# Patient Record
Sex: Female | Born: 1995 | Race: White | Hispanic: No | Marital: Single | Smoking: Never smoker
Health system: Southern US, Community
[De-identification: ages and names within clinical notes are randomized; demographics above are authoritative.]

## PROBLEM LIST (undated history)

## (undated) DIAGNOSIS — K219 Gastro-esophageal reflux disease without esophagitis: Secondary | ICD-10-CM

## (undated) DIAGNOSIS — T7840XA Allergy, unspecified, initial encounter: Secondary | ICD-10-CM

## (undated) HISTORY — DX: Gastro-esophageal reflux disease without esophagitis: K21.9

## (undated) HISTORY — DX: Allergy, unspecified, initial encounter: T78.40XA

---
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
## 2014-06-01 ENCOUNTER — Other Ambulatory Visit: Payer: Self-pay | Admitting: Urgent Care

## 2014-09-06 ENCOUNTER — Telehealth: Payer: Self-pay | Admitting: Family Medicine

## 2014-09-06 DIAGNOSIS — N946 Dysmenorrhea, unspecified: Secondary | ICD-10-CM

## 2014-09-06 MED ORDER — DESOGESTREL-ETHINYL ESTRADIOL 0.15-30 MG-MCG PO TABS: 1.0000 | ORAL_TABLET | Freq: Every day | ORAL | Status: DC

## 2014-09-06 NOTE — Telephone Encounter (Signed)
LMTCB Mila Pair Drozdowski, CMA  

## 2014-09-06 NOTE — Telephone Encounter (Signed)
Pt request a nurse to return their call:  Pt stated she stopped taking Aviane earlier in May but would like to try another brand of birthcontrol. Thanks TNP

## 2014-09-06 NOTE — Telephone Encounter (Signed)
Pt states she wants to restart OCP's to control dysmenorrhea and severe PMS sx. LMP started 08/25/2014 and lasted until yesterday. Pt reports she has never been sexually active. Spoke with Dr. Elease HashimotoMaloney, and she agrees to start pt on higher estrogen than Aviane (20mcg) to control PMS sx. Sent in Desogen (30mcg) to [facility] on [address] Pt will schedule 3 week FU. Allene DillonEmily Drozdowski, CMA

## 2014-09-08 ENCOUNTER — Ambulatory Visit (INDEPENDENT_AMBULATORY_CARE_PROVIDER_SITE_OTHER): Payer: BLUE CROSS/BLUE SHIELD | Admitting: Family Medicine

## 2014-09-08 ENCOUNTER — Encounter: Payer: Self-pay | Admitting: Family Medicine

## 2014-09-08 VITALS — BP 100/62 | HR 76 | Temp 98.2°F | Resp 16 | Ht 63.75 in | Wt 148.0 lb

## 2014-09-08 DIAGNOSIS — J309 Allergic rhinitis, unspecified: Secondary | ICD-10-CM | POA: Insufficient documentation

## 2014-09-08 DIAGNOSIS — F410 Panic disorder [episodic paroxysmal anxiety] without agoraphobia: Secondary | ICD-10-CM | POA: Insufficient documentation

## 2014-09-08 DIAGNOSIS — N943 Premenstrual tension syndrome: Secondary | ICD-10-CM | POA: Diagnosis not present

## 2014-09-08 DIAGNOSIS — N760 Acute vaginitis: Secondary | ICD-10-CM

## 2014-09-08 DIAGNOSIS — N946 Dysmenorrhea, unspecified: Secondary | ICD-10-CM | POA: Insufficient documentation

## 2014-09-08 DIAGNOSIS — N926 Irregular menstruation, unspecified: Secondary | ICD-10-CM | POA: Insufficient documentation

## 2014-09-08 DIAGNOSIS — B373 Candidiasis of vulva and vagina: Secondary | ICD-10-CM | POA: Diagnosis not present

## 2014-09-08 DIAGNOSIS — B3731 Acute candidiasis of vulva and vagina: Secondary | ICD-10-CM

## 2014-09-08 DIAGNOSIS — K219 Gastro-esophageal reflux disease without esophagitis: Secondary | ICD-10-CM | POA: Insufficient documentation

## 2014-09-08 DIAGNOSIS — G4700 Insomnia, unspecified: Secondary | ICD-10-CM | POA: Insufficient documentation

## 2014-09-08 MED ORDER — TERCONAZOLE 0.8 % VA CREA: 1.0000 | TOPICAL_CREAM | Freq: Every day | VAGINAL | Status: DC

## 2014-09-08 NOTE — Progress Notes (Signed)
   Subjective:    Patient ID: Heather Steele, female    DOB: 14-Mar-1996, 19 y.o.   MRN: [id number]  Vaginal Pain The patient's pertinent negatives include no genital itching, genital lesions, genital odor, genital rash, missed menses, pelvic pain, vaginal bleeding or vaginal discharge. Primary symptoms comment: Pt's main complaint is "vaginal burning". This is a new problem. The current episode started in the past 7 days. The problem occurs constantly. The problem has been gradually worsening. The pain is mild. The problem affects both sides. She is not pregnant. Pertinent negatives include no abdominal pain, anorexia, back pain, chills, constipation, diarrhea, discolored urine, dysuria, fever, flank pain, frequency, headaches, hematuria, joint pain, joint swelling, nausea, rash, sore throat, urgency or vomiting. Treatments tried: OTC hemorrhoid cream for a possible hemorrhoid, pt believes this cream is what caused the pain. She is not sexually active. Her past medical history is significant for menorrhagia.      Review of Systems  Constitutional: Negative for fever and chills.  HENT: Negative for sore throat.   Gastrointestinal: Negative for nausea, vomiting, abdominal pain, diarrhea, constipation and anorexia.  Genitourinary: Positive for vaginal pain and menorrhagia. Negative for dysuria, urgency, frequency, hematuria, flank pain, vaginal discharge, pelvic pain and missed menses.  Musculoskeletal: Negative for back pain and joint pain.  Skin: Negative for rash.  Neurological: Negative for headaches.   BP 100/62 mmHg  Pulse 76  Temp(Src) 98.2 F (36.8 C) (Oral)  Resp 16  Ht 5' 3.75" (1.619 m)  Wt 148 lb (67.132 kg)  BMI 25.61 kg/m2  LMP 08/25/2014 (Exact Date)   No past medical history on file. Past Surgical History  Procedure Laterality Date  . Knee surgery Left 12/24/2009    Dr. Celedonio Savage    reports that she has never smoked. She has never used smokeless tobacco. She reports that she  does not drink alcohol or use illicit drugs. family history includes Asthma in her brother; Stroke in her maternal grandfather. No Known Allergies     Objective:   Physical Exam  Constitutional: She is oriented to person, place, and time. She appears well-developed and well-nourished.  Genitourinary: Vaginal discharge found.  Neurological: She is alert and oriented to person, place, and time.  Psychiatric: She has a normal mood and affect.          Assessment & Plan:  1. Vaginitis New problem. Most Likely candida, will send wet prep to lab. FU pending results. Pt adamant she is not sexually active, but depending on wet prep results, may have to check for G/C Chlamydia.  - WET PREP FOR TRICH, YEAST, CLUE  Also for PMDD, discussed risks and benefits of OCPs. Will start medication on Sunday.  No currently sexually active, but did discuss does not prevent STDs and when miss pills need back up method.   Patient seen and examined by Leo Grosser, MD, and note scribed by Allene Dillon, CMA.  I have reviewed the document for accuracy and completeness and I agree with above. Leo Grosser, MD   Lorie Phenix, MD

## 2014-09-10 LAB — WET PREP FOR TRICH, YEAST, CLUE
Clue Cell Exam: NEGATIVE
TRICHOMONAS EXAM: NEGATIVE
YEAST EXAM: NEGATIVE

## 2014-09-11 ENCOUNTER — Telehealth: Payer: Self-pay

## 2014-09-11 NOTE — Telephone Encounter (Signed)
-----   Message from Lorie Phenix, MD sent at 09/10/2014  1:12 PM EDT ----- Wet prep normal. Please see how patient is doing.  Thanks.

## 2014-09-11 NOTE — Telephone Encounter (Signed)
Pt advised as directed below.  She reports feeling much better.    Thanks,   -Vernona Rieger

## 2014-09-13 ENCOUNTER — Ambulatory Visit: Payer: Self-pay | Admitting: Family Medicine

## 2014-09-27 ENCOUNTER — Ambulatory Visit: Payer: Self-pay | Admitting: Family Medicine

## 2014-12-12 ENCOUNTER — Encounter: Payer: Self-pay | Admitting: Family Medicine

## 2014-12-12 ENCOUNTER — Ambulatory Visit (INDEPENDENT_AMBULATORY_CARE_PROVIDER_SITE_OTHER): Payer: BLUE CROSS/BLUE SHIELD | Admitting: Family Medicine

## 2014-12-12 VITALS — BP 105/56 | HR 100 | Temp 98.6°F | Resp 16 | Ht 63.5 in | Wt 152.0 lb

## 2014-12-12 DIAGNOSIS — Z0000 Encounter for general adult medical examination without abnormal findings: Secondary | ICD-10-CM

## 2014-12-12 DIAGNOSIS — N926 Irregular menstruation, unspecified: Secondary | ICD-10-CM

## 2014-12-12 DIAGNOSIS — R748 Abnormal levels of other serum enzymes: Secondary | ICD-10-CM | POA: Diagnosis not present

## 2014-12-12 DIAGNOSIS — Z23 Encounter for immunization: Secondary | ICD-10-CM | POA: Diagnosis not present

## 2014-12-12 NOTE — Addendum Note (Signed)
Addended by: Leo Grosser on: 12/12/2014 08:04 PM   Modules accepted: Kipp Brood

## 2014-12-12 NOTE — Progress Notes (Addendum)
Patient ID: Heather Steele, female   DOB: 11/20/1995, 19 y.o.   MRN: [id number]        Patient: Heather Steele, Female    DOB: Apr 25, 1995, 19 y.o.   MRN: [id number] Visit Date: 12/12/2014  Today's Provider: Margarita Rana, MD   Chief Complaint  Patient presents with  . Annual Exam   Subjective:    Annual physical exam Heather Steele is a 19 y.o. female who presents today for health maintenance and complete physical. She feels well. She reports exercising DAILY. She reports she is sleeping well. School is going well. 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 Looking into working into college athletics. Has 3 room mates.  Getting along well. No boyfriend. Home is going well. Good relationship with parents. Both work at [employer].  Brother also there.  Anxiety has been well controlled. Has periodic chest issues.  Thinks related to sleep.   Birth control has really helped her cycle.  Does feel bloated at times.   12/08/13 CPE 12/08/13 EKG  Lab Results  Component Value Date   HGB 13.9 12/15/2013   HCT 41 12/15/2013   PLT 303 12/15/2013   ALT 8 12/15/2013   AST 41* 12/15/2013   NA 140 12/15/2013   K 4.2 12/15/2013   CREATININE 0.8 12/15/2013   BUN 10 12/15/2013   TSH 2.35 12/15/2013    -----------------------------------------------------------------   Review of Systems  Constitutional: Negative.   HENT: Negative.   Eyes: Negative.   Respiratory: Negative.   Cardiovascular: Negative.   Gastrointestinal: Negative.   Endocrine: Negative.   Genitourinary: Negative.   Musculoskeletal: Negative.   Skin: Negative.   Allergic/Immunologic: Negative.   Neurological: Negative.   Hematological: Negative.   Psychiatric/Behavioral: Negative.     Social History She  reports that she has never smoked. She has never used smokeless tobacco. She reports that she does not drink alcohol or use illicit drugs. Social History   Social History  . Marital Status: Single    Spouse Name:  N/A  . Number of Children: N/A  . Years of Education: N/A   Social History Main Topics  . Smoking status: Never Smoker   . Smokeless tobacco: Never Used  . Alcohol Use: No  . Drug Use: No  . Sexual Activity: No   Other Topics Concern  . None   Social History Narrative    Patient Active Problem List   Diagnosis Date Noted  . Allergic rhinitis 09/08/2014  . Gastro-esophageal reflux disease without esophagitis 09/08/2014  . Cannot sleep 09/08/2014  . Irregular bleeding 09/08/2014  . Dysmenorrhea 09/08/2014  . Panic attack 09/08/2014    Past Surgical History  Procedure Laterality Date  . Knee surgery Left 12/24/2009    Dr. Horton Chin    Family History  Family Status  Relation Status Death Age  . Brother Alive   . Mother Alive   . Father Alive   . Brother Alive   . Brother Alive    Her family history includes Asthma in her brother; Stroke in her maternal grandfather.    No Known Allergies  Previous Medications   DESOGESTREL-ETHINYL ESTRADIOL (JULEBER) 0.15-30 MG-MCG TABLET    Take 1 tablet by mouth daily.    Patient Care Team: Margarita Rana, MD as PCP - General (Family Medicine)     Objective:   Vitals: BP 105/56 mmHg  Pulse 100  Temp(Src) 98.6 F (37 C) (Oral)  Resp 16  Ht 5' 3.5" (1.613 m)  Wt 152  lb (68.947 kg)  BMI 26.50 kg/m2  LMP 11/28/2014 (Exact Date)   Physical Exam  Constitutional: She is oriented to person, place, and time. She appears well-developed and well-nourished.  HENT:  Head: Normocephalic and atraumatic.  Right Ear: Tympanic membrane, external ear and ear canal normal.  Left Ear: Tympanic membrane, external ear and ear canal normal.  Nose: Nose normal.  Mouth/Throat: Uvula is midline, oropharynx is clear and moist and mucous membranes are normal.  Eyes: Conjunctivae, EOM and lids are normal. Pupils are equal, round, and reactive to light.  Neck: Trachea normal and normal range of motion. Neck supple. Carotid bruit is not present. No  thyroid mass and no thyromegaly present.  Cardiovascular: Normal rate, regular rhythm and normal heart sounds.   Pulmonary/Chest: Effort normal and breath sounds normal.  Abdominal: Soft. Normal appearance and bowel sounds are normal. There is no hepatosplenomegaly. There is no tenderness.  Musculoskeletal: Normal range of motion.  Lymphadenopathy:    She has no cervical adenopathy.    She has no axillary adenopathy.  Neurological: She is alert and oriented to person, place, and time. She has normal strength. No cranial nerve deficit.  Skin: Skin is warm, dry and intact.  Psychiatric: She has a normal mood and affect. Her speech is normal and behavior is normal. Judgment and thought content normal. Cognition and memory are normal.     Depression Screen PHQ 2/9 Scores 09/08/2014  PHQ - 2 Score 0      Assessment & Plan:     Routine Health Maintenance and Physical Exam  Exercise Activities and Dietary recommendations Goals    . Exercise 150 minutes per week (moderate activity)       Immunization History  Administered Date(s) Administered  . DTaP 06/03/1994, 08/05/1994, 10/27/1994, 11/06/1995, 07/13/1998  . HPV Quadrivalent 11/05/2006, 01/13/2007, 05/20/2007  . Hepatitis A 11/05/2006, 05/20/2007  . Hepatitis B 07-Feb-1995, 08/27/1994, 01/05/1995  . HiB (PRP-OMP) 06/03/1994, 08/05/1994, 10/27/1994, 08/05/1995  . IPV 06/03/1994, 08/05/1994, 11/06/1995, 07/13/1998  . MMR 07/31/1995, 07/13/1998  . Meningococcal Conjugate 11/05/2006  . Tdap 09/17/2005  . Varicella 04/08/1996, 09/17/2005    Health Maintenance  Topic Date Due  . HIV Screening  04/04/2009  . TETANUS/TDAP  04/04/2013  . INFLUENZA VACCINE  10/30/2014      1. Annual physical exam Continue to eat healthy and exercise. Anxiety under good control at this point. Call if worsens or does not improve.    2. Need for influenza vaccination - Flu Vaccine QUAD 36+ mos PF IM (Fluarix & Fluzone Quad PF)  3. Elevated  liver enzymes Recheck labs. - CBC with Differential/Platelet - Comprehensive metabolic panel  4. Irregular menses Improved on OCPs. Will call if worsens.  - CBC with Differential/Platelet - Comprehensive metabolic panel - TSH  Patient was seen and examined by Jerrell Belfast, MD, and note scribed by Lynford Humphrey, [place].   I have reviewed the document for accuracy and completeness and I agree with above. Jerrell Belfast, MD   Margarita Rana, MD    --------------------------------------------------------------------

## 2014-12-12 NOTE — Addendum Note (Signed)
Addended by: Leo Grosser on: 12/12/2014 08:06 PM   Modules accepted: Kipp Brood

## 2014-12-14 ENCOUNTER — Telehealth: Payer: Self-pay

## 2014-12-14 LAB — CBC WITH DIFFERENTIAL/PLATELET
BASOS ABS: 0 10*3/uL (ref 0.0–0.2)
BASOS: 1 %
EOS (ABSOLUTE): 0 10*3/uL (ref 0.0–0.4)
Eos: 1 %
HEMOGLOBIN: 12.9 g/dL (ref 11.1–15.9)
Hematocrit: 38.5 % (ref 34.0–46.6)
IMMATURE GRANS (ABS): 0 10*3/uL (ref 0.0–0.1)
Immature Granulocytes: 0 %
LYMPHS ABS: 1.6 10*3/uL (ref 0.7–3.1)
Lymphs: 27 %
MCH: 30.7 pg (ref 26.6–33.0)
MCHC: 33.5 g/dL (ref 31.5–35.7)
MCV: 92 fL (ref 79–97)
MONOCYTES: 12 %
Monocytes Absolute: 0.7 10*3/uL (ref 0.1–0.9)
NEUTROS ABS: 3.6 10*3/uL (ref 1.4–7.0)
Neutrophils: 59 %
Platelets: 285 10*3/uL (ref 150–379)
RBC: 4.2 x10E6/uL (ref 3.77–5.28)
RDW: 12.8 % (ref 12.3–15.4)
WBC: 5.9 10*3/uL (ref 3.4–10.8)

## 2014-12-14 LAB — COMPREHENSIVE METABOLIC PANEL
A/G RATIO: 1.9 (ref 1.1–2.5)
ALBUMIN: 4.3 g/dL (ref 3.5–5.5)
ALK PHOS: 34 IU/L — AB (ref 39–117)
ALT: 9 IU/L (ref 0–32)
AST: 11 IU/L (ref 0–40)
BILIRUBIN TOTAL: 0.9 mg/dL (ref 0.0–1.2)
BUN / CREAT RATIO: 7 — AB (ref 8–20)
BUN: 6 mg/dL (ref 6–20)
CHLORIDE: 100 mmol/L (ref 97–108)
CO2: 24 mmol/L (ref 18–29)
Calcium: 9.1 mg/dL (ref 8.7–10.2)
Creatinine, Ser: 0.83 mg/dL (ref 0.57–1.00)
GFR calc non Af Amer: 102 mL/min/{1.73_m2} (ref >59)
GFR, EST AFRICAN AMERICAN: 117 mL/min/{1.73_m2} (ref >59)
GLOBULIN, TOTAL: 2.3 g/dL (ref 1.5–4.5)
GLUCOSE: 100 mg/dL — AB (ref 65–99)
POTASSIUM: 4 mmol/L (ref 3.5–5.2)
SODIUM: 140 mmol/L (ref 134–144)
TOTAL PROTEIN: 6.6 g/dL (ref 6.0–8.5)

## 2014-12-14 LAB — TSH: TSH: 1.18 u[IU]/mL (ref 0.450–4.500)

## 2014-12-14 NOTE — Telephone Encounter (Signed)
-----   Message from Lorie Phenix, MD sent at 12/14/2014 11:11 AM EDT ----- Labs stable. Please notify patient. Thanks.

## 2014-12-14 NOTE — Telephone Encounter (Signed)
LMTCB 12/14/2014  Thanks,   -Salia Cangemi  

## 2014-12-14 NOTE — Telephone Encounter (Signed)
Pt advised.   Thanks,   -Laura  

## 2015-07-04 ENCOUNTER — Telehealth: Payer: Self-pay | Admitting: Family Medicine

## 2015-07-04 NOTE — Telephone Encounter (Signed)
Patient reports she is taking Juleber and has been for a while now. Her menstrual cycle has been regular each month until this month.    Patient states she is 3 pills away from taking the sugar pills, and has already been on menstrual cycle for 3 days.  Patient said [place] was in [place] last week and with the time change, and missing a dose may have caused the irregularity.  Patient wanted to know what she should do to regulate her cycle again? Please advise.

## 2015-07-04 NOTE — Telephone Encounter (Signed)
Patient advised as directed below. Patient verbalized understanding.  

## 2015-07-04 NOTE — Telephone Encounter (Signed)
Pt has been having problems with her birth control this month.  Wanted to talk to a nurse on what to do.  Did not say exactly what what going on.  wanting to know if she should start a new pack.  Her call back is [phone]  Thanks, Barth Kirksteri

## 2015-07-04 NOTE — Telephone Encounter (Signed)
Nothing to do to regulate it.  Continue pills as is. Use back up method of birth control if sexually active as may ovulate and continue pills as scheduled. Thanks.

## 2015-07-31 ENCOUNTER — Other Ambulatory Visit: Payer: Self-pay | Admitting: Family Medicine

## 2015-12-10 DIAGNOSIS — D485 Neoplasm of uncertain behavior of skin: Secondary | ICD-10-CM | POA: Diagnosis not present

## 2015-12-10 DIAGNOSIS — L400 Psoriasis vulgaris: Secondary | ICD-10-CM | POA: Diagnosis not present

## 2015-12-10 DIAGNOSIS — D2261 Melanocytic nevi of right upper limb, including shoulder: Secondary | ICD-10-CM | POA: Diagnosis not present

## 2015-12-10 DIAGNOSIS — Z1283 Encounter for screening for malignant neoplasm of skin: Secondary | ICD-10-CM | POA: Diagnosis not present

## 2015-12-10 DIAGNOSIS — L812 Freckles: Secondary | ICD-10-CM | POA: Diagnosis not present

## 2015-12-10 DIAGNOSIS — D229 Melanocytic nevi, unspecified: Secondary | ICD-10-CM | POA: Diagnosis not present

## 2016-01-09 ENCOUNTER — Encounter: Payer: Self-pay | Admitting: Physician Assistant

## 2016-01-09 ENCOUNTER — Ambulatory Visit (INDEPENDENT_AMBULATORY_CARE_PROVIDER_SITE_OTHER): Payer: BLUE CROSS/BLUE SHIELD | Admitting: Physician Assistant

## 2016-01-09 VITALS — BP 114/60 | HR 98 | Temp 98.4°F | Resp 16 | Ht 64.0 in | Wt 154.2 lb

## 2016-01-09 DIAGNOSIS — Z0000 Encounter for general adult medical examination without abnormal findings: Secondary | ICD-10-CM | POA: Diagnosis not present

## 2016-01-09 DIAGNOSIS — Z124 Encounter for screening for malignant neoplasm of cervix: Secondary | ICD-10-CM

## 2016-01-09 NOTE — Patient Instructions (Signed)
Health Maintenance, Female Adopting a healthy lifestyle and getting preventive care can go a long way to promote health and wellness. Talk with your health care provider about what schedule of regular examinations is right for you. This is a good chance for you to check in with your provider about disease prevention and staying healthy. In between checkups, there are plenty of things you can do on your own. Experts have done a lot of research about which lifestyle changes and preventive measures are most likely to keep you healthy. Ask your health care provider for more information. WEIGHT AND DIET  Eat a healthy diet  Be sure to include plenty of vegetables, fruits, low-fat dairy products, and lean protein.  Do not eat a lot of foods high in solid fats, added sugars, or salt.  Get regular exercise. This is one of the most important things you can do for your health.  Most adults should exercise for at least 150 minutes each week. The exercise should increase your heart rate and make you sweat (moderate-intensity exercise).  Most adults should also do strengthening exercises at least twice a week. This is in addition to the moderate-intensity exercise.  Maintain a healthy weight  Body mass index (BMI) is a measurement that can be used to identify possible weight problems. It estimates body fat based on height and weight. Your health care provider can help determine your BMI and help you achieve or maintain a healthy weight.  For females 28 years of age and older:   A BMI below 18.5 is considered underweight.  A BMI of 18.5 to 24.9 is normal.  A BMI of 25 to 29.9 is considered overweight.  A BMI of 30 and above is considered obese.  Watch levels of cholesterol and blood lipids  You should start having your blood tested for lipids and cholesterol at 21 years of age, then have this test every 5 years.  You may need to have your cholesterol levels checked more often if:  Your lipid  or cholesterol levels are high.  You are older than 21 years of age.  You are at high risk for heart disease.  CANCER SCREENING   Lung Cancer  Lung cancer screening is recommended for adults 75-66 years old who are at high risk for lung cancer because of a history of smoking.  A yearly low-dose CT scan of the lungs is recommended for people who:  Currently smoke.  Have quit within the past 15 years.  Have at least a 30-pack-year history of smoking. A pack year is smoking an average of one pack of cigarettes a day for 1 year.  Yearly screening should continue until it has been 15 years since you quit.  Yearly screening should stop if you develop a health problem that would prevent you from having lung cancer treatment.  Breast Cancer  Practice breast self-awareness. This means understanding how your breasts normally appear and feel.  It also means doing regular breast self-exams. Let your health care provider know about any changes, no matter how small.  If you are in your 20s or 30s, you should have a clinical breast exam (CBE) by a health care provider every 1-3 years as part of a regular health exam.  If you are 25 or older, have a CBE every year. Also consider having a breast X-ray (mammogram) every year.  If you have a family history of breast cancer, talk to your health care provider about genetic screening.  If you  are at high risk for breast cancer, talk to your health care provider about having an MRI and a mammogram every year.  Breast cancer gene (BRCA) assessment is recommended for women who have family members with BRCA-related cancers. BRCA-related cancers include:  Breast.  Ovarian.  Tubal.  Peritoneal cancers.  Results of the assessment will determine the need for genetic counseling and BRCA1 and BRCA2 testing. Cervical Cancer Your health care provider may recommend that you be screened regularly for cancer of the pelvic organs (ovaries, uterus, and  vagina). This screening involves a pelvic examination, including checking for microscopic changes to the surface of your cervix (Pap test). You may be encouraged to have this screening done every 3 years, beginning at age 21.  For women ages 30-65, health care providers may recommend pelvic exams and Pap testing every 3 years, or they may recommend the Pap and pelvic exam, combined with testing for human papilloma virus (HPV), every 5 years. Some types of HPV increase your risk of cervical cancer. Testing for HPV may also be done on women of any age with unclear Pap test results.  Other health care providers may not recommend any screening for nonpregnant women who are considered low risk for pelvic cancer and who do not have symptoms. Ask your health care provider if a screening pelvic exam is right for you.  If you have had past treatment for cervical cancer or a condition that could lead to cancer, you need Pap tests and screening for cancer for at least 20 years after your treatment. If Pap tests have been discontinued, your risk factors (such as having a new sexual partner) need to be reassessed to determine if screening should resume. Some women have medical problems that increase the chance of getting cervical cancer. In these cases, your health care provider may recommend more frequent screening and Pap tests. Colorectal Cancer  This type of cancer can be detected and often prevented.  Routine colorectal cancer screening usually begins at 21 years of age and continues through 21 years of age.  Your health care provider may recommend screening at an earlier age if you have risk factors for colon cancer.  Your health care provider may also recommend using home test kits to check for hidden blood in the stool.  A small camera at the end of a tube can be used to examine your colon directly (sigmoidoscopy or colonoscopy). This is done to check for the earliest forms of colorectal  cancer.  Routine screening usually begins at age 50.  Direct examination of the colon should be repeated every 5-10 years through 21 years of age. However, you may need to be screened more often if early forms of precancerous polyps or small growths are found. Skin Cancer  Check your skin from head to toe regularly.  Tell your health care provider about any new moles or changes in moles, especially if there is a change in a mole's shape or color.  Also tell your health care provider if you have a mole that is larger than the size of a pencil eraser.  Always use sunscreen. Apply sunscreen liberally and repeatedly throughout the day.  Protect yourself by wearing long sleeves, pants, a wide-brimmed hat, and sunglasses whenever you are outside. HEART DISEASE, DIABETES, AND HIGH BLOOD PRESSURE   High blood pressure causes heart disease and increases the risk of stroke. High blood pressure is more likely to develop in:  People who have blood pressure in the high end   of the normal range (130-139/85-89 mm Hg).  People who are overweight or obese.  People who are African American.  If you are 38-23 years of age, have your blood pressure checked every 3-5 years. If you are 61 years of age or older, have your blood pressure checked every year. You should have your blood pressure measured twice--once when you are at a hospital or clinic, and once when you are not at a hospital or clinic. Record the average of the two measurements. To check your blood pressure when you are not at a hospital or clinic, you can use:  An automated blood pressure machine at a pharmacy.  A home blood pressure monitor.  If you are between 45 years and 39 years old, ask your health care provider if you should take aspirin to prevent strokes.  Have regular diabetes screenings. This involves taking a blood sample to check your fasting blood sugar level.  If you are at a normal weight and have a low risk for diabetes,  have this test once every three years after 21 years of age.  If you are overweight and have a high risk for diabetes, consider being tested at a younger age or more often. PREVENTING INFECTION  Hepatitis B  If you have a higher risk for hepatitis B, you should be screened for this virus. You are considered at high risk for hepatitis B if:  You were born in a country where hepatitis B is common. Ask your health care provider which countries are considered high risk.  Your parents were born in a high-risk country, and you have not been immunized against hepatitis B (hepatitis B vaccine).  You have HIV or AIDS.  You use needles to inject street drugs.  You live with someone who has hepatitis B.  You have had sex with someone who has hepatitis B.  You get hemodialysis treatment.  You take certain medicines for conditions, including cancer, organ transplantation, and autoimmune conditions. Hepatitis C  Blood testing is recommended for:  Everyone born from [birth year] through 1966.  Anyone with known risk factors for hepatitis C. Sexually transmitted infections (STIs)  You should be screened for sexually transmitted infections (STIs) including gonorrhea and chlamydia if:  You are sexually active and are younger than 21 years of age.  You are older than 21 years of age and your health care provider tells you that you are at risk for this type of infection.  Your sexual activity has changed since you were last screened and you are at an increased risk for chlamydia or gonorrhea. Ask your health care provider if you are at risk.  If you do not have HIV, but are at risk, it may be recommended that you take a prescription medicine daily to prevent HIV infection. This is called pre-exposure prophylaxis (PrEP). You are considered at risk if:  You are sexually active and do not regularly use condoms or know the HIV status of your partner(s).  You take drugs by injection.  You are sexually  active with a partner who has HIV. Talk with your health care provider about whether you are at high risk of being infected with HIV. If you choose to begin PrEP, you should first be tested for HIV. You should then be tested every 3 months for as long as you are taking PrEP.  PREGNANCY   If you are premenopausal and you may become pregnant, ask your health care provider about preconception counseling.  If you may  become pregnant, take 400 to 800 micrograms (mcg) of folic acid every day.  If you want to prevent pregnancy, talk to your health care provider about birth control (contraception). OSTEOPOROSIS AND MENOPAUSE   Osteoporosis is a disease in which the bones lose minerals and strength with aging. This can result in serious bone fractures. Your risk for osteoporosis can be identified using a bone density scan.  If you are 61 years of age or older, or if you are at risk for osteoporosis and fractures, ask your health care provider if you should be screened.  Ask your health care provider whether you should take a calcium or vitamin D supplement to lower your risk for osteoporosis.  Menopause may have certain physical symptoms and risks.  Hormone replacement therapy may reduce some of these symptoms and risks. Talk to your health care provider about whether hormone replacement therapy is right for you.  HOME CARE INSTRUCTIONS   Schedule regular health, dental, and eye exams.  Stay current with your immunizations.   Do not use any tobacco products including cigarettes, chewing tobacco, or electronic cigarettes.  If you are pregnant, do not drink alcohol.  If you are breastfeeding, limit how much and how often you drink alcohol.  Limit alcohol intake to no more than 1 drink per day for nonpregnant women. One drink equals 12 ounces of beer, 5 ounces of wine, or 1 ounces of hard liquor.  Do not use street drugs.  Do not share needles.  Ask your health care provider for help if  you need support or information about quitting drugs.  Tell your health care provider if you often feel depressed.  Tell your health care provider if you have ever been abused or do not feel safe at home.   This information is not intended to replace advice given to you by your health care provider. Make sure you discuss any questions you have with your health care provider.   Document Released: 09/30/2010 Document Revised: 04/07/2014 Document Reviewed: 02/16/2013 Elsevier Interactive Patient Education [employer].

## 2016-01-09 NOTE — Progress Notes (Signed)
Patient: Heather Steele, Female    DOB: 06-04-1995, 20 y.o.   MRN: [id number] Visit Date: 01/09/2016  Today's Provider: Mar Daring, PA-C   Chief Complaint  Patient presents with  . Annual Exam   Subjective:    Annual physical exam [name] is a 20 y.o. female who presents today for health maintenance and complete physical. She feels well. She reports exercising. She reports she is sleeping well. She reports she had her Influenza vaccine September 27,2017 at [facility].   Patient with c/o of bleeding with intercourse. She reports that she only has had sex 10 times. She reports it is her first time and she wants to know if this is normal. She reports she has not missed any dose of her contraception. -----------------------------------------------------------------   Review of Systems  Constitutional: Negative.   HENT: Negative.   Eyes: Negative.   Respiratory: Negative.   Cardiovascular: Negative.   Gastrointestinal: Negative.   Endocrine: Negative.   Genitourinary: Negative.   Musculoskeletal: Negative.   Skin: Negative.   Allergic/Immunologic: Negative.   Neurological: Negative.   Hematological: Negative.   Psychiatric/Behavioral: Negative.     Social History      She  reports that she has never smoked. She has never used smokeless tobacco. She reports that she drinks alcohol. She reports that she does not use drugs.       Social History   Social History  . Marital status: Single    Spouse name: N/A  . Number of children: N/A  . Years of education: N/A   Social History Main Topics  . Smoking status: Never Smoker  . Smokeless tobacco: Never Used  . Alcohol use Yes     Comment: rarely  . Drug use: No  . Sexual activity: Yes   Other Topics Concern  . None   Social History Narrative  . None    Past Medical History:  Diagnosis Date  . Allergy   . GERD (gastroesophageal reflux disease)      Patient Active Problem List   Diagnosis Date Noted  . Allergic rhinitis 09/08/2014  . Gastro-esophageal reflux disease without esophagitis 09/08/2014  . Cannot sleep 09/08/2014  . Irregular bleeding 09/08/2014  . Dysmenorrhea 09/08/2014  . Panic attack 09/08/2014    Past Surgical History:  Procedure Laterality Date  . KNEE SURGERY Left 12/24/2009   Dr. Horton Chin    Family History        Family Status  Relation Status  . Brother Alive  . Mother Alive  . Father Alive  . Brother Alive  . Brother Alive  . Maternal Grandfather         Her family history includes Asthma in her brother; Stroke in her maternal grandfather.    No Known Allergies  Current Meds  Medication Sig  . desogestrel-ethinyl estradiol (JULEBER) 0.15-30 MG-MCG tablet Take 1 tablet by mouth daily.    Patient Care Team: Margarita Rana, MD as PCP - General (Family Medicine)     Objective:   Vitals: BP 114/60 (BP Location: Right Arm, Patient Position: Sitting, Cuff Size: Normal)   Pulse 98   Temp 98.4 F (36.9 C) (Oral)   Resp 16   Ht '5\' 4"'  (1.626 m)   Wt 154 lb 3.2 oz (69.9 kg)   LMP 12/30/2015   BMI 26.47 kg/m    Physical Exam  Constitutional: She is oriented to person, place, and time. She appears well-developed and well-nourished. No distress.  HENT:  Head: Normocephalic and atraumatic.  Right Ear: Hearing, tympanic membrane, external ear and ear canal normal.  Left Ear: Hearing, tympanic membrane, external ear and ear canal normal.  Nose: Nose normal.  Mouth/Throat: Uvula is midline, oropharynx is clear and moist and mucous membranes are normal. No oropharyngeal exudate.  Eyes: Conjunctivae and EOM are normal. Pupils are equal, round, and reactive to light. Right eye exhibits no discharge. Left eye exhibits no discharge. No scleral icterus.  Neck: Normal range of motion. Neck supple. No JVD present. Carotid bruit is not present. No tracheal deviation present. No thyromegaly present.  Cardiovascular: Normal rate, regular  rhythm, normal heart sounds and intact distal pulses.  Exam reveals no gallop and no friction rub.   No murmur heard. Pulmonary/Chest: Effort normal and breath sounds normal. No respiratory distress. She has no wheezes. She has no rales. She exhibits no tenderness. Right breast exhibits no inverted nipple, no mass, no nipple discharge, no skin change and no tenderness. Left breast exhibits no inverted nipple, no mass, no nipple discharge, no skin change and no tenderness. Breasts are symmetrical.  Abdominal: Soft. Bowel sounds are normal. She exhibits no distension and no mass. There is no tenderness. There is no rebound and no guarding. Hernia confirmed negative in the right inguinal area and confirmed negative in the left inguinal area.  Genitourinary: Rectum normal, vagina normal and uterus normal. No breast swelling, tenderness, discharge or bleeding. Pelvic exam was performed with patient supine. There is no rash, tenderness, lesion or injury on the right labia. There is no rash, tenderness, lesion or injury on the left labia. Cervix exhibits discharge (milky, white mucous consistency). Cervix exhibits no motion tenderness and no friability. Right adnexum displays no mass, no tenderness and no fullness. Left adnexum displays no mass, no tenderness and no fullness. No erythema, tenderness or bleeding in the vagina. No signs of injury around the vagina. No vaginal discharge found.    Musculoskeletal: Normal range of motion. She exhibits no edema or tenderness.  Lymphadenopathy:    She has no cervical adenopathy.       Right: No inguinal adenopathy present.       Left: No inguinal adenopathy present.  Neurological: She is alert and oriented to person, place, and time. She has normal reflexes. No cranial nerve deficit. Coordination normal.  Skin: Skin is warm and dry. No rash noted. She is not diaphoretic.  Psychiatric: She has a normal mood and affect. Her behavior is normal. Judgment and thought  content normal.  Vitals reviewed.    Depression Screen PHQ 2/9 Scores 09/08/2014  PHQ - 2 Score 0      Assessment & Plan:     Routine Health Maintenance and Physical Exam  Exercise Activities and Dietary recommendations Goals    . Exercise 150 minutes per week (moderate activity)       Immunization History  Administered Date(s) Administered  . DTaP 06/03/1994, 08/05/1994, 10/27/1994, 11/06/1995, 07/13/1998  . HPV Quadrivalent 11/05/2006, 01/13/2007, 05/20/2007  . Hepatitis A 11/05/2006, 05/20/2007  . Hepatitis B Jun 16, 1995, 08/27/1994, 01/05/1995  . HiB (PRP-OMP) 06/03/1994, 08/05/1994, 10/27/1994, 08/05/1995  . IPV 06/03/1994, 08/05/1994, 11/06/1995, 07/13/1998  . Influenza,inj,Quad PF,36+ Mos 12/12/2014  . MMR 07/31/1995, 07/13/1998  . Meningococcal Conjugate 11/05/2006, 12/18/2011  . Td 09/17/2005  . Tdap 09/17/2005  . Varicella 04/08/1996, 09/17/2005    Health Maintenance  Topic Date Due  . HIV Screening  04/04/2009  . TETANUS/TDAP  04/04/2013  . PAP SMEAR  04/05/2015  . INFLUENZA VACCINE  10/30/2015      Discussed health benefits of physical activity, and encouraged her to engage in regular exercise appropriate for her age and condition.    1. Annual physical exam Normal physical exam with exception of area near cervical os. Pap collected and will await results. Will f/u pending results. If pap normal will see her back in one year. She is to call for acute issues in the meantime.  2. Cervical cancer screening Pap collected today. Will send as below and f/u pending results. - Pap IG, CT/NG w/ reflex HPV when [facility])  --------------------------------------------------------------------    Mar Daring, PA-C  [facility]

## 2016-01-14 ENCOUNTER — Telehealth: Payer: Self-pay

## 2016-01-14 LAB — PAP IG, CT-NG, RFX HPV ASCU
CHLAMYDIA, NUC. ACID AMP: NEGATIVE
Gonococcus by Nucleic Acid Amp: NEGATIVE
PAP SMEAR COMMENT: 0

## 2016-01-14 NOTE — Telephone Encounter (Signed)
Patient advised as below.  

## 2016-01-14 NOTE — Telephone Encounter (Signed)
-----   Message from Margaretann LovelessJennifer M Burnette, PA-C sent at 01/14/2016 12:22 PM EDT ----- Pap is normal.  GC/Chlamydia neg. Will repeat in 3 years.

## 2016-02-04 DIAGNOSIS — Z79899 Other long term (current) drug therapy: Secondary | ICD-10-CM | POA: Diagnosis not present

## 2016-02-04 DIAGNOSIS — R208 Other disturbances of skin sensation: Secondary | ICD-10-CM | POA: Diagnosis not present

## 2016-02-04 DIAGNOSIS — L400 Psoriasis vulgaris: Secondary | ICD-10-CM | POA: Diagnosis not present

## 2016-02-16 DIAGNOSIS — J020 Streptococcal pharyngitis: Secondary | ICD-10-CM | POA: Diagnosis not present

## 2016-02-16 DIAGNOSIS — J029 Acute pharyngitis, unspecified: Secondary | ICD-10-CM | POA: Diagnosis not present

## 2016-02-19 ENCOUNTER — Encounter: Payer: Self-pay | Admitting: Physician Assistant

## 2016-02-19 ENCOUNTER — Ambulatory Visit (INDEPENDENT_AMBULATORY_CARE_PROVIDER_SITE_OTHER): Payer: BLUE CROSS/BLUE SHIELD | Admitting: Physician Assistant

## 2016-02-19 VITALS — BP 102/60 | HR 98 | Temp 98.3°F | Resp 16 | Wt 158.0 lb

## 2016-02-19 DIAGNOSIS — J020 Streptococcal pharyngitis: Secondary | ICD-10-CM

## 2016-02-19 DIAGNOSIS — J351 Hypertrophy of tonsils: Secondary | ICD-10-CM | POA: Diagnosis not present

## 2016-02-19 MED ORDER — DOXYCYCLINE HYCLATE 100 MG PO TABS: 100.0000 mg | ORAL_TABLET | Freq: Two times a day (BID) | ORAL | 0 refills | Status: DC

## 2016-02-19 MED ORDER — PREDNISONE 10 MG (21) PO TBPK: ORAL_TABLET | ORAL | 0 refills | Status: DC

## 2016-02-19 NOTE — Patient Instructions (Signed)
Strep Throat Strep throat is a bacterial infection of the throat. Your health care provider may call the infection tonsillitis or pharyngitis, depending on whether there is swelling in the tonsils or at the back of the throat. Strep throat is most common during the cold months of the year in children who are 5-21 years of age, but it can happen during any season in people of any age. This infection is spread from person to person (contagious) through coughing, sneezing, or close contact. What are the causes? Strep throat is caused by the bacteria called Streptococcus pyogenes. What increases the risk? This condition is more likely to develop in:  People who spend time in crowded places where the infection can spread easily.  People who have close contact with someone who has strep throat.  What are the signs or symptoms? Symptoms of this condition include:  Fever or chills.  Redness, swelling, or pain in the tonsils or throat.  Pain or difficulty when swallowing.  White or yellow spots on the tonsils or throat.  Swollen, tender glands in the neck or under the jaw.  Red rash all over the body (rare).  How is this diagnosed? This condition is diagnosed by performing a rapid strep test or by taking a swab of your throat (throat culture test). Results from a rapid strep test are usually ready in a few minutes, but throat culture test results are available after one or two days. How is this treated? This condition is treated with antibiotic medicine. Follow these instructions at home: Medicines  Take over-the-counter and prescription medicines only as told by your health care provider.  Take your antibiotic as told by your health care provider. Do not stop taking the antibiotic even if you start to feel better.  Have family members who also have a sore throat or fever tested for strep throat. They may need antibiotics if they have the strep infection. Eating and drinking  Do not  share food, drinking cups, or personal items that could cause the infection to spread to other people.  If swallowing is difficult, try eating soft foods until your sore throat feels better.  Drink enough fluid to keep your urine clear or pale yellow. General instructions  Gargle with a salt-water mixture 3-4 times per day or as needed. To make a salt-water mixture, completely dissolve -1 tsp of salt in 1 cup of warm water.  Make sure that all household members wash their hands well.  Get plenty of rest.  Stay home from school or work until you have been taking antibiotics for 24 hours.  Keep all follow-up visits as told by your health care provider. This is important. Contact a health care provider if:  The glands in your neck continue to get bigger.  You develop a rash, cough, or earache.  You cough up a thick liquid that is green, yellow-brown, or bloody.  You have pain or discomfort that does not get better with medicine.  Your problems seem to be getting worse rather than better.  You have a fever. Get help right away if:  You have new symptoms, such as vomiting, severe headache, stiff or painful neck, chest pain, or shortness of breath.  You have severe throat pain, drooling, or changes in your voice.  You have swelling of the neck, or the skin on the neck becomes red and tender.  You have signs of dehydration, such as fatigue, dry mouth, and decreased urination.  You become increasingly sleepy, or   you cannot wake up completely.  Your joints become red or painful. This information is not intended to replace advice given to you by your health care provider. Make sure you discuss any questions you have with your health care provider. Document Released: 03/14/2000 Document Revised: 11/14/2015 Document Reviewed: 07/10/2014 Elsevier Interactive Patient Education  2017 [employer]  

## 2016-02-19 NOTE — Progress Notes (Signed)
Patient: Heather Steele Female    DOB: 12/19/1995   20 y.o.   MRN: [id number] Visit Date: 02/19/2016  Today's Provider: Margaretann LovelessJennifer M Burnette, PA-C   No chief complaint on file.  Subjective:    HPI  Patient here c/o sore throat, swollen tonsils and fever (tactile) since 02/15/2016 . Patient was [facility] at [facility] on [facility]/16/2016 and was started on Amoxicillin 875 mg BID. Patient reports she is also taking OTC sudafed and ibuprofen daily. Patient reports that she was started on Otesza for psoriasis by Dr. [place] dermatologist. Patient reports that one of the side effects of medication is URI. Patient D/C [facility] on 02/10/2016. Patient reports that her mother and brother have both been treated for strep and brother for mono.      No Known Allergies   Current Outpatient Prescriptions:  .  amoxicillin (AMOXIL) 875 MG tablet, Take 875 mg by mouth 2 (two) times daily. for 10 days, Disp: , Rfl: 0 .  CLOBEX 0.05 % shampoo, Shampoo into SCALP every other NIGHT alternating WITH T-Sal shampoo, Disp: , Rfl: 6 .  desogestrel-ethinyl estradiol (JULEBER) 0.15-30 MG-MCG tablet, Take 1 tablet by mouth daily., Disp: , Rfl:  .  ibuprofen (ADVIL,MOTRIN) 600 MG tablet, take 1 tablet by mouth three times a day with food, Disp: , Rfl: 0 .  KERALAC 47 % CREA, Apply TO affected AREAS OF THE SCALP every other NIGHT. WASH OFF IN THE morning., Disp: , Rfl: 6  Review of Systems  Constitutional: Positive for fever.  HENT: Positive for congestion, ear pain, postnasal drip, rhinorrhea, sore throat, trouble swallowing and voice change.   Eyes: Negative.   Respiratory: Positive for cough.   Cardiovascular: Negative.     Social History  Substance Use Topics  . Smoking status: Never Smoker  . Smokeless tobacco: Never Used  . Alcohol use Yes     Comment: rarely   Objective:   BP 102/60 (BP Location: Left Arm, Patient Position: Sitting, Cuff Size: Normal)   Pulse 98   Temp 98.3 F (36.8 C) (Oral)    Resp 16   Wt 158 lb (71.7 kg)   LMP 02/19/2016 (Exact Date)   SpO2 97%   BMI 27.12 kg/m   Physical Exam  Constitutional: She appears well-developed and well-nourished. No distress.  HENT:  Head: Normocephalic and atraumatic.  Right Ear: Hearing, tympanic membrane, external ear and ear canal normal.  Left Ear: Hearing, tympanic membrane, external ear and ear canal normal.  Nose: Nose normal. Right sinus exhibits no maxillary sinus tenderness and no frontal sinus tenderness. Left sinus exhibits no maxillary sinus tenderness and no frontal sinus tenderness.  Mouth/Throat: Uvula is midline and mucous membranes are normal. Oropharyngeal exudate, posterior oropharyngeal edema and posterior oropharyngeal erythema present. No tonsillar abscesses.  Eyes: Conjunctivae are normal. Pupils are equal, round, and reactive to light. Right eye exhibits no discharge. Left eye exhibits no discharge. No scleral icterus.  Neck: Normal range of motion. Neck supple. No tracheal deviation present. No thyromegaly present.  Cardiovascular: Normal rate, regular rhythm and normal heart sounds.  Exam reveals no gallop and no friction rub.   No murmur heard. Pulmonary/Chest: Effort normal and breath sounds normal. No stridor. No respiratory distress. She has no wheezes. She has no rales.  Lymphadenopathy:    She has no cervical adenopathy.  Skin: Skin is warm and dry. She is not diaphoretic.  Vitals reviewed.     Assessment & Plan:  1. Strep throat Being that she has not responded to amoxicillin in 4 days will have her discontinue the amoxicillin and switch her to doxycycline as below. Discussed using saltwater gargles and Chloraseptic Spray. Prednisone was also given as below for enlargement of tonsils. She is to call the office if symptoms fail to improve or if she develops any difficulty swallowing or breathing. - doxycycline (VIBRA-TABS) 100 MG tablet; Take 1 tablet (100 mg total) by mouth 2 (two) times  daily.  Dispense: 20 tablet; Refill: 0  2. Tonsillar enlargement See above medical treatment plan. - predniSONE (STERAPRED UNI-PAK 21 TAB) 10 MG (21) TBPK tablet; Take as directed on package instructions  Dispense: 21 tablet; Refill: 0       Margaretann LovelessJennifer M Burnette, PA-C  [facility]

## 2016-04-10 ENCOUNTER — Encounter: Payer: Self-pay | Admitting: Physician Assistant

## 2016-04-10 ENCOUNTER — Ambulatory Visit (INDEPENDENT_AMBULATORY_CARE_PROVIDER_SITE_OTHER): Payer: BLUE CROSS/BLUE SHIELD | Admitting: Physician Assistant

## 2016-04-10 VITALS — BP 100/70 | HR 88 | Temp 98.2°F | Resp 16 | Wt 156.8 lb

## 2016-04-10 DIAGNOSIS — F411 Generalized anxiety disorder: Secondary | ICD-10-CM

## 2016-04-10 MED ORDER — ESCITALOPRAM OXALATE 10 MG PO TABS: ORAL_TABLET | ORAL | 1 refills | Status: DC

## 2016-04-10 NOTE — Progress Notes (Signed)
Patient: Heather Steele Female    DOB: [birth year]   21 y.o.   MRN: [id number] Visit Date: 04/10/2016  Today's Provider: Margaretann Loveless, PA-C   Chief Complaint  Patient presents with  . Anxiety   Subjective:    Anxiety  Presents for initial visit. Onset was more than 5 years ago (She reports that she had the last panic attack in the last 6 month. But reports a week ago she started having other symptoms). Symptoms include decreased concentration, excessive worry, insomnia, nervous/anxious behavior, palpitations and restlessness. Patient reports no chest pain, depressed mood, feeling of choking, irritability, shortness of breath or suicidal ideas. Primary symptoms comment: All the symptoms started a week ago. Episode frequency: once a week. The severity of symptoms is moderate. Hours of sleep per night: Normally 6-8, recently 3-4 hours. The quality of sleep is fair. Nighttime awakenings: one to two.   There are no known risk factors. Past treatments include counseling (CBT), lifestyle changes and herbal remedies. The treatment provided mild relief. Compliance with prior treatments has been good.      No Known Allergies   Current Outpatient Prescriptions:  .  CLOBEX 0.05 % shampoo, Shampoo into SCALP every other NIGHT alternating WITH T-Sal shampoo, Disp: , Rfl: 6 .  desogestrel-ethinyl estradiol (JULEBER) 0.15-30 MG-MCG tablet, Take 1 tablet by mouth daily., Disp: , Rfl:  .  ibuprofen (ADVIL,MOTRIN) 600 MG tablet, take 1 tablet by mouth three times a day with food, Disp: , Rfl: 0 .  KERALAC 47 % CREA, Apply TO affected AREAS OF THE SCALP every other NIGHT. WASH OFF IN THE morning., Disp: , Rfl: 6 .  doxycycline (VIBRA-TABS) 100 MG tablet, Take 1 tablet (100 mg total) by mouth 2 (two) times daily. (Patient not taking: Reported on 04/10/2016), Disp: 20 tablet, Rfl: 0 .  predniSONE (STERAPRED UNI-PAK 21 TAB) 10 MG (21) TBPK tablet, Take as directed on package instructions (Patient  not taking: Reported on 04/10/2016), Disp: 21 tablet, Rfl: 0  Review of Systems  Constitutional: Negative.  Negative for irritability.  Respiratory: Negative for shortness of breath.   Cardiovascular: Positive for palpitations. Negative for chest pain.  Gastrointestinal: Negative.   Psychiatric/Behavioral: Positive for decreased concentration and sleep disturbance. Negative for agitation, dysphoric mood, self-injury and suicidal ideas. The patient is nervous/anxious and has insomnia.     Social History  Substance Use Topics  . Smoking status: Never Smoker  . Smokeless tobacco: Never Used  . Alcohol use Yes     Comment: rarely   Objective:   BP 100/70 (BP Location: Right Arm, Patient Position: Sitting, Cuff Size: Normal)   Pulse 88   Temp 98.2 F (36.8 C) (Oral)   Resp 16   Wt 156 lb 12.8 oz (71.1 kg)   LMP 03/18/2016   BMI 26.91 kg/m   Physical Exam  Constitutional: She appears well-developed and well-nourished. No distress.  Neck: Normal range of motion. Neck supple.  Cardiovascular: Normal rate, regular rhythm and normal heart sounds.  Exam reveals no gallop and no friction rub.   No murmur heard. Pulmonary/Chest: Effort normal and breath sounds normal. No respiratory distress. She has no wheezes. She has no rales.  Skin: She is not diaphoretic.  Psychiatric: She has a normal mood and affect. Her behavior is normal. Judgment and thought content normal.  Vitals reviewed.     Assessment & Plan:     1. GAD (generalized anxiety disorder) Worsening symptoms over a few years. She  has been using coping mechanisms and therapy. Over the last few weeks symptoms have worsened even with her coping. She has not had a panic attack in many years. Will start lexapro as below and I will see her back in 4 weeks.  - escitalopram (LEXAPRO) 10 MG tablet; Take 1/2 tab PO q h.s. X 1 week, then increase to 1 tab PO q h.s.  Dispense: 30 tablet; Refill: 1       Margaretann LovelessJennifer M Burnette, PA-C    [facility]

## 2016-04-10 NOTE — Patient Instructions (Signed)
Generalized Anxiety Disorder Generalized anxiety disorder (GAD) is a mental disorder. It interferes with life functions, including relationships, work, and school. GAD is different from normal anxiety, which everyone experiences at some point in their lives in response to specific life events and activities. Normal anxiety actually helps us prepare for and get through these life events and activities. Normal anxiety goes away after the event or activity is over.  GAD causes anxiety that is not necessarily related to specific events or activities. It also causes excess anxiety in proportion to specific events or activities. The anxiety associated with GAD is also difficult to control. GAD can vary from mild to severe. People with severe GAD can have intense waves of anxiety with physical symptoms (panic attacks).  SYMPTOMS The anxiety and worry associated with GAD are difficult to control. This anxiety and worry are related to many life events and activities and also occur more days than not for 6 months or longer. People with GAD also have three or more of the following symptoms (one or more in children):  Restlessness.   Fatigue.  Difficulty concentrating.   Irritability.  Muscle tension.  Difficulty sleeping or unsatisfying sleep. DIAGNOSIS GAD is diagnosed through an assessment by your health care provider. Your health care provider will ask you questions aboutyour mood,physical symptoms, and events in your life. Your health care provider may ask you about your medical history and use of alcohol or drugs, including prescription medicines. Your health care provider may also do a physical exam and blood tests. Certain medical conditions and the use of certain substances can cause symptoms similar to those associated with GAD. Your health care provider may refer you to a mental health specialist for further evaluation. TREATMENT The following therapies are usually used to treat GAD:    Medication. Antidepressant medication usually is prescribed for long-term daily control. Antianxiety medicines may be added in severe cases, especially when panic attacks occur.   Talk therapy (psychotherapy). Certain types of talk therapy can be helpful in treating GAD by providing support, education, and guidance. A form of talk therapy called cognitive behavioral therapy can teach you healthy ways to think about and react to daily life events and activities.  Stress managementtechniques. These include yoga, meditation, and exercise and can be very helpful when they are practiced regularly. A mental health specialist can help determine which treatment is best for you. Some people see improvement with one therapy. However, other people require a combination of therapies. This information is not intended to replace advice given to you by your health care provider. Make sure you discuss any questions you have with your health care provider. Document Released: 07/12/2012 Document Revised: 04/07/2014 Document Reviewed: 07/12/2012 Elsevier Interactive Patient Education  2017 Elsevier Inc. Escitalopram tablets What is this medicine? ESCITALOPRAM (es sye TAL oh pram) is used to treat depression and certain types of anxiety. This medicine may be used for other purposes; ask your health care provider or pharmacist if you have questions. COMMON BRAND NAME(S): Lexapro What should I tell my health care provider before I take this medicine? They need to know if you have any of these conditions: -bipolar disorder or a family history of bipolar disorder -diabetes -glaucoma -heart disease -kidney or liver disease -receiving electroconvulsive therapy -seizures (convulsions) -suicidal thoughts, plans, or attempt by you or a family member -an unusual or allergic reaction to escitalopram, the related drug citalopram, other medicines, foods, dyes, or preservatives -pregnant or trying to become  pregnant -breast-feeding How  should I use this medicine? Take this medicine by mouth with a glass of water. Follow the directions on the prescription label. You can take it with or without food. If it upsets your stomach, take it with food. Take your medicine at regular intervals. Do not take it more often than directed. Do not stop taking this medicine suddenly except upon the advice of your doctor. Stopping this medicine too quickly may cause serious side effects or your condition may worsen. A special MedGuide will be given to you by the pharmacist with each prescription and refill. Be sure to read this information carefully each time. Talk to your pediatrician regarding the use of this medicine in children. Special care may be needed. Overdosage: If you think you have taken too much of this medicine contact a poison control center or emergency room at once. NOTE: This medicine is only for you. Do not share this medicine with others. What if I miss a dose? If you miss a dose, take it as soon as you can. If it is almost time for your next dose, take only that dose. Do not take double or extra doses. What may interact with this medicine? Do not take this medicine with any of the following medications: -certain medicines for fungal infections like fluconazole, itraconazole, ketoconazole, posaconazole, voriconazole -cisapride -citalopram -dofetilide -dronedarone -linezolid -MAOIs like Carbex, Eldepryl, Marplan, Nardil, and Parnate -methylene blue (injected into a vein) -pimozide -thioridazine -ziprasidone This medicine may also interact with the following medications: -alcohol -amphetamines -aspirin and aspirin-like medicines -carbamazepine -certain medicines for depression, anxiety, or psychotic disturbances -certain medicines for migraine headache like almotriptan, eletriptan, frovatriptan, naratriptan, rizatriptan, sumatriptan, zolmitriptan -certain medicines for sleep -certain  medicines that treat or prevent blood clots like warfarin, enoxaparin, dalteparin -cimetidine -diuretics -fentanyl -furazolidone -isoniazid -lithium -metoprolol -NSAIDs, medicines for pain and inflammation, like ibuprofen or naproxen -other medicines that prolong the QT interval (cause an abnormal heart rhythm) -procarbazine -rasagiline -supplements like St. John's wort, kava kava, valerian -tramadol -tryptophan This list may not describe all possible interactions. Give your health care provider a list of all the medicines, herbs, non-prescription drugs, or dietary supplements you use. Also tell them if you smoke, drink alcohol, or use illegal drugs. Some items may interact with your medicine. What should I watch for while using this medicine? Tell your doctor if your symptoms do not get better or if they get worse. Visit your doctor or health care professional for regular checks on your progress. Because it may take several weeks to see the full effects of this medicine, it is important to continue your treatment as prescribed by your doctor. Patients and their families should watch out for new or worsening thoughts of suicide or depression. Also watch out for sudden changes in feelings such as feeling anxious, agitated, panicky, irritable, hostile, aggressive, impulsive, severely restless, overly excited and hyperactive, or not being able to sleep. If this happens, especially at the beginning of treatment or after a change in dose, call your health care professional. Bonita QuinYou may get drowsy or dizzy. Do not drive, use machinery, or do anything that needs mental alertness until you know how this medicine affects you. Do not stand or sit up quickly, especially if you are an older patient. This reduces the risk of dizzy or fainting spells. Alcohol may interfere with the effect of this medicine. Avoid alcoholic drinks. Your mouth may get dry. Chewing sugarless gum or sucking hard candy, and drinking  plenty of water may help. Contact  your doctor if the problem does not go away or is severe. What side effects may I notice from receiving this medicine? Side effects that you should report to your doctor or health care professional as soon as possible: -allergic reactions like skin rash, itching or hives, swelling of the face, lips, or tongue -anxious -black, tarry stools -changes in vision -confusion -elevated mood, decreased need for sleep, racing thoughts, impulsive behavior -eye pain -fast, irregular heartbeat -feeling faint or lightheaded, falls -feeling agitated, angry, or irritable -hallucination, loss of contact with reality -loss of balance or coordination -loss of memory -painful or prolonged erections -restlessness, pacing, inability to keep still -seizures -stiff muscles -suicidal thoughts or other mood changes -trouble sleeping -unusual bleeding or bruising -unusually weak or tired -vomiting Side effects that usually do not require medical attention (report to your doctor or health care professional if they continue or are bothersome): -changes in appetite -change in sex drive or performance -headache -increased sweating -indigestion, nausea -tremors This list may not describe all possible side effects. Call your doctor for medical advice about side effects. You may report side effects to FDA at 1-800-FDA-1088. Where should I keep my medicine? Keep out of reach of children. Store at room temperature between 15 and 30 degrees C (59 and 86 degrees F). Throw away any unused medicine after the expiration date. NOTE: This sheet is a summary. It may not cover all possible information. If you have questions about this medicine, talk to your doctor, pharmacist, or health care provider.  2017 Elsevier/Gold Standard (2015-08-20 13:20:23)

## 2016-05-05 ENCOUNTER — Other Ambulatory Visit: Payer: Self-pay | Admitting: Physician Assistant

## 2016-05-05 DIAGNOSIS — J020 Streptococcal pharyngitis: Secondary | ICD-10-CM

## 2016-05-05 DIAGNOSIS — F411 Generalized anxiety disorder: Secondary | ICD-10-CM

## 2016-05-05 MED ORDER — ESCITALOPRAM OXALATE 10 MG PO TABS: 10.0000 mg | ORAL_TABLET | Freq: Every day | ORAL | 5 refills | Status: DC

## 2016-05-05 NOTE — Telephone Encounter (Signed)
Notify patient that lex[facility]ro has been [facility] [address]

## 2016-07-15 ENCOUNTER — Other Ambulatory Visit: Payer: Self-pay | Admitting: Physician Assistant

## 2016-07-15 ENCOUNTER — Other Ambulatory Visit: Payer: Self-pay

## 2016-07-15 DIAGNOSIS — Z3041 Encounter for surveillance of contraceptive pills: Secondary | ICD-10-CM

## 2016-07-15 MED ORDER — DESOGESTREL-ETHINYL ESTRADIOL 0.15-30 MG-MCG PO TABS: 1.0000 | ORAL_TABLET | Freq: Every day | ORAL | 11 refills | Status: DC

## 2016-07-15 NOTE — Progress Notes (Signed)
Birth control refilled.

## 2016-07-16 ENCOUNTER — Other Ambulatory Visit: Payer: Self-pay | Admitting: Physician Assistant

## 2016-07-16 DIAGNOSIS — Z3041 Encounter for surveillance of contraceptive pills: Secondary | ICD-10-CM

## 2016-07-16 MED ORDER — DESOGESTREL-ETHINYL ESTRADIOL 0.15-30 MG-MCG PO TABS: 1.0000 | ORAL_TABLET | Freq: Every day | ORAL | 11 refills | Status: DC

## 2016-07-30 ENCOUNTER — Encounter: Payer: Self-pay | Admitting: Physician Assistant

## 2016-07-30 ENCOUNTER — Ambulatory Visit (INDEPENDENT_AMBULATORY_CARE_PROVIDER_SITE_OTHER): Payer: BLUE CROSS/BLUE SHIELD | Admitting: Physician Assistant

## 2016-07-30 DIAGNOSIS — F411 Generalized anxiety disorder: Secondary | ICD-10-CM

## 2016-07-30 MED ORDER — ESCITALOPRAM OXALATE 10 MG PO TABS: 10.0000 mg | ORAL_TABLET | Freq: Every day | ORAL | 5 refills | Status: DC

## 2016-07-30 NOTE — Progress Notes (Signed)
Patient: Heather Steele Female    DOB: 10/27/1995   21 y.o.   MRN: [id number] Visit Date: 07/30/2016  Today's Provider: Margaretann Loveless, PA-C   Chief Complaint  Patient presents with  . Anxiety   Subjective:    HPI  Patient is here today to discuss Anxiety Medication. Patient was seen 3 months ago for generalized anxiety disorder with worsening symptoms and was started on Lexapro 10 mg. She reports that everything is very good right now. She reports no side effect.She reports that she has been having mild symptoms that she has been able to control. She gets chest pain (mild) when the palpitations are going to start off and on but she does deep breathing and goes away.She wants to talk with provider if she should continue with medication or stop. She is about to graduate May 18th from [employer].     No Known Allergies   Current Outpatient Prescriptions:  .  CLOBEX 0.05 % shampoo, Shampoo into SCALP every other NIGHT alternating WITH T-Sal shampoo, Disp: , Rfl: 6 .  desogestrel-ethinyl estradiol (JULEBER) 0.15-30 MG-MCG tablet, Take 1 tablet by mouth daily., Disp: 1 Package, Rfl: 11 .  escitalopram (LEXAPRO) 10 MG tablet, Take 1 tablet (10 mg total) by mouth at bedtime., Disp: 30 tablet, Rfl: 5 .  ibuprofen (ADVIL,MOTRIN) 600 MG tablet, take 1 tablet by mouth three times a day with food, Disp: , Rfl: 0 .  doxycycline (VIBRA-TABS) 100 MG tablet, Take 1 tablet (100 mg total) by mouth 2 (two) times daily. (Patient not taking: Reported on 04/10/2016), Disp: 20 tablet, Rfl: 0 .  KERALAC 47 % CREA, Apply TO affected AREAS OF THE SCALP every other NIGHT. WASH OFF IN THE morning., Disp: , Rfl: 6 .  predniSONE (STERAPRED UNI-PAK 21 TAB) 10 MG (21) TBPK tablet, Take as directed on package instructions (Patient not taking: Reported on 04/10/2016), Disp: 21 tablet, Rfl: 0  Review of Systems  Constitutional: Negative.   Respiratory: Negative.   Cardiovascular: Negative for chest pain, palpitations  and leg swelling.  Psychiatric/Behavioral: Negative for agitation, decreased concentration, dysphoric mood, self-injury, sleep disturbance and suicidal ideas. The patient is nervous/anxious (a little).     Social History  Substance Use Topics  . Smoking status: Never Smoker  . Smokeless tobacco: Never Used  . Alcohol use Yes     Comment: rarely   Objective:   BP 100/60 (BP Location: Right Arm, Patient Position: Sitting, Cuff Size: Normal)   Pulse 87   Temp 98.4 F (36.9 C) (Oral)   Resp 16   Wt 161 lb 6.4 oz (73.2 kg)   LMP 07/08/2016   BMI 27.70 kg/m    Physical Exam  Constitutional: She appears well-developed and well-nourished. No distress.  Neck: Normal range of motion. Neck supple.  Cardiovascular: Normal rate, regular rhythm and normal heart sounds.  Exam reveals no gallop and no friction rub.   No murmur heard. Pulmonary/Chest: Effort normal and breath sounds normal. No respiratory distress. She has no wheezes. She has no rales.  Skin: She is not diaphoretic.  Psychiatric: She has a normal mood and affect. Her behavior is normal. Judgment and thought content normal.  Vitals reviewed.       Assessment & Plan:     1. GAD (generalized anxiety disorder) Improved and doing well with Lexapro. Medication refilled as below. Advised patient to stay on until she is through graduation and interviewing for new positions, then once she feels she  is stable may discontinue. Advised to cut in half x 1 week then stop. She voiced understanding. If symptoms return she may restart. - escitalopram (LEXAPRO) 10 MG tablet; Take 1 tablet (10 mg total) by mouth at bedtime.  Dispense: 30 tablet; Refill: 5       Lexton Hidalgo M Margaretann LovelessSelect [facility]

## 2016-07-30 NOTE — Patient Instructions (Signed)
10 Relaxation Techniques That Zap Stress Fast By Jeannette Moninger   Listen  Relax. You deserve it, it's good for you, and it takes less time than you think. You don't need a spa weekend or a retreat. Each of these stress-relieving tips can get you from OMG to om in less than 15 minutes. 1. Meditate  A few minutes of practice per day can help ease anxiety. "Research suggests that daily meditation may alter the brain's neural pathways, making you more resilient to stress," says psychologist Robbie Maller Hartman, PhD, a [place] health and wellness coach. It's simple. Sit up straight with both feet on the floor. Close your eyes. Focus your attention on reciting -- out loud or silently -- a positive mantra such as "I feel at peace" or "I love myself." Place one hand on your belly to sync the mantra with your breaths. Let any distracting thoughts float by like clouds. 2. Breathe Deeply  Take a 5-minute break and focus on your breathing. Sit up straight, eyes closed, with a hand on your belly. Slowly inhale through your nose, feeling the breath start in your abdomen and work its way to the top of your head. Reverse the process as you exhale through your mouth.  "Deep breathing counters the effects of stress by slowing the heart rate and lowering blood pressure," psychologist Judith Tutin, PhD, says. She's a certified life coach in [place], [place] 3. Be Present  Slow down.  "Take 5 minutes and focus on only one behavior with awareness," Tutin says. Notice how the air feels on your face when you're walking and how your feet feel hitting the ground. Enjoy the texture and taste of each bite of food. When you spend time in the moment and focus on your senses, you should feel less tense. 4. Reach Out  Your social network is one of your best tools for handling stress. Talk to others -- preferably face to face, or at least on the phone. Share what's going on. You can get a fresh perspective while keeping your  connection strong. 5. Tune In to Your Body  Mentally scan your body to get a sense of how stress affects it each day. Lie on your back, or sit with your feet on the floor. Start at your toes and work your way up to your scalp, noticing how your body feels.  10 Relaxation Techniques That Zap Stress Fast By Jeannette Moninger   Listen  "Simply be aware of places you feel tight or loose without trying to change anything," Tutin says. For 1 to 2 minutes, imagine each deep breath flowing to that body part. Repeat this process as you move your focus up your body, paying close attention to sensations you feel in each body part. 6. Decompress  Place a warm heat wrap around your neck and shoulders for 10 minutes. Close your eyes and relax your face, neck, upper chest, and back muscles. Remove the wrap, and use a tennis ball or foam roller to massage away tension.  "Place the ball between your back and the wall. Lean into the ball, and hold gentle pressure for up to 15 seconds. Then move the ball to another spot, and apply pressure," says Cathy Benninger, a nurse practitioner and assistant professor at [employer] [facility] in [place]. 7. Laugh Out Loud  A good belly laugh doesn't just lighten the load mentally. It lowers cortisol, your body's stress hormone, and boosts brain chemicals called endorphins, which help   your mood. Lighten up by tuning in to your favorite sitcom or video, reading the comics, or chatting with someone who makes you smile. 8. Crank Up the Tunes  Research shows that listening to soothing music can lower blood pressure, heart rate, and anxiety. "Create a playlist of songs or nature sounds (the ocean, a bubbling brook, birds chirping), and allow your mind to focus on the different melodies, instruments, or singers in the piece," Benninger says. You also can blow off steam by rocking out to more upbeat tunes -- or singing at the top of your lungs! 9. Get Moving   You don't have to run in order to get a runner's high. All forms of exercise, including yoga and walking, can ease depression and anxiety by helping the brain release feel-good chemicals and by giving your body a chance to practice dealing with stress. You can go for a quick walk around the block, take the stairs up and down a few flights, or do some stretching exercises like head rolls and shoulder shrugs. 10. Be Grateful  Keep a gratitude journal or several (one by your bed, one in your purse, and one at work) to help you remember all the things that are good in your life.  "Being grateful for your blessings cancels out negative thoughts and worries," says Joni Emmerling, a wellness coach in [place], .  Use these journals to savor good experiences like a child's smile, a sunshine-filled day, and good health. Don't forget to celebrate accomplishments like mastering a new task at work or a new hobby. When you start feeling stressed, spend a few minutes looking through your notes to remind yourself what really matters.  

## 2016-09-01 ENCOUNTER — Telehealth: Payer: Self-pay | Admitting: Physician Assistant

## 2016-09-01 DIAGNOSIS — F411 Generalized anxiety disorder: Secondary | ICD-10-CM

## 2016-09-01 DIAGNOSIS — Z3041 Encounter for surveillance of contraceptive pills: Secondary | ICD-10-CM

## 2016-09-01 MED ORDER — DESOGESTREL-ETHINYL ESTRADIOL 0.15-30 MG-MCG PO TABS: 1.0000 | ORAL_TABLET | Freq: Every day | ORAL | 1 refills | Status: AC

## 2016-09-01 MED ORDER — ESCITALOPRAM OXALATE 10 MG PO TABS: 10.0000 mg | ORAL_TABLET | Freq: Every day | ORAL | 1 refills | Status: AC

## 2016-09-01 NOTE — Telephone Encounter (Signed)
Note received stating patient is relocating to [place] and is requesting extended refill until she establishes care there. These will be sent o [facility] for her.  Please notify patient at [phone] that a 90 day supply was sent in for both lexapro and her birth control.

## 2016-09-01 NOTE — Telephone Encounter (Signed)
pt informed

## 2017-07-09 DIAGNOSIS — H1045 Other chronic allergic conjunctivitis: Secondary | ICD-10-CM | POA: Diagnosis not present

## 2018-01-14 DIAGNOSIS — Z113 Encounter for screening for infections with a predominantly sexual mode of transmission: Secondary | ICD-10-CM | POA: Diagnosis not present

## 2018-01-14 DIAGNOSIS — N921 Excessive and frequent menstruation with irregular cycle: Secondary | ICD-10-CM | POA: Diagnosis not present

## 2018-02-02 DIAGNOSIS — Z23 Encounter for immunization: Secondary | ICD-10-CM | POA: Diagnosis not present

## 2018-03-13 DIAGNOSIS — B349 Viral infection, unspecified: Secondary | ICD-10-CM | POA: Diagnosis not present

## 2019-05-17 ENCOUNTER — Other Ambulatory Visit: Payer: Self-pay | Admitting: Orthopedic Surgery

## 2019-05-17 DIAGNOSIS — M25561 Pain in right knee: Secondary | ICD-10-CM

## 2019-05-23 ENCOUNTER — Other Ambulatory Visit: Payer: Self-pay

## 2019-05-23 ENCOUNTER — Ambulatory Visit
Admission: RE | Admit: 2019-05-23 | Discharge: 2019-05-23 | Disposition: A | Discharge status: Home / Self Care | Payer: BC Managed Care – PPO | Source: Ambulatory Visit | Attending: Orthopedic Surgery | Admitting: Orthopedic Surgery

## 2019-05-23 DIAGNOSIS — M25561 Pain in right knee: Secondary | ICD-10-CM | POA: Diagnosis not present

## 2022-02-15 IMAGING — MR MR KNEE*R* W/O CM
6 series · 40 of 40 positions shown · non-contrast
Comparison: Right MRI dated November 12, 2013.

CLINICAL DATA: Persistent medial knee pain since walking 2 weeks
ago.

EXAM:
MRI OF THE RIGHT KNEE WITHOUT CONTRAST
TECHNIQUE: Multiplanar, multisequence MR imaging of the knee was performed. No
intravenous contrast was administered.

[Series 20: T2 fat-sat · axial · right · 4.0mm · 0.50mm/px · z∈[-180,-55]mm · 6 of 26 slices shown (1 of 3)]
[im 1/26]
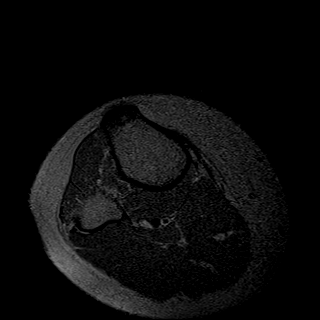
[im 6/26]
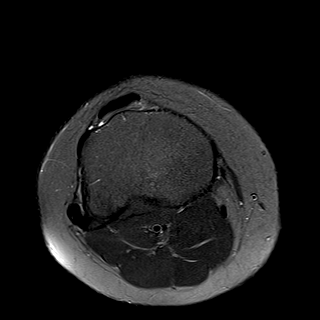
[im 11/26]
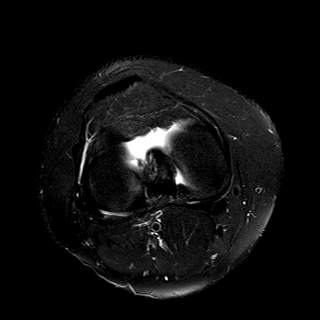
[im 16/26]
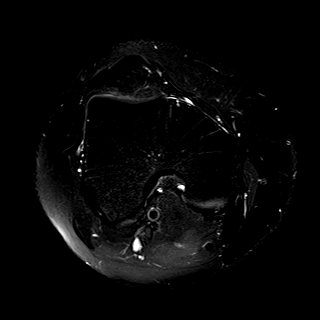
[im 21/26]
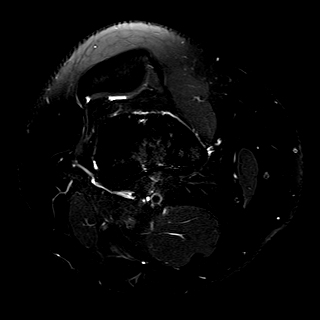
[im 26/26]
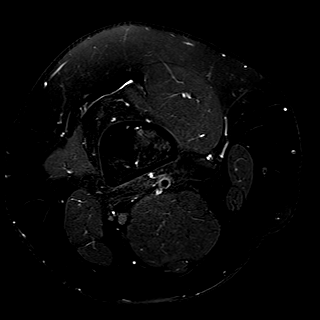

[Series 21: T1 · coronal · right · 4.0mm · 0.42mm/px · 6 of 28 slices shown]
[im 1/28]
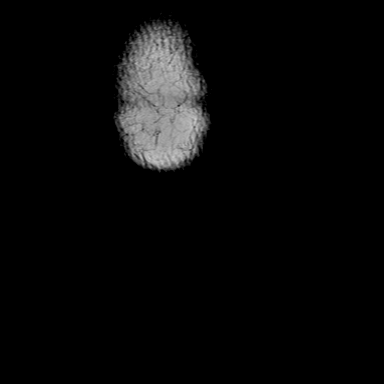
[im 6/28]
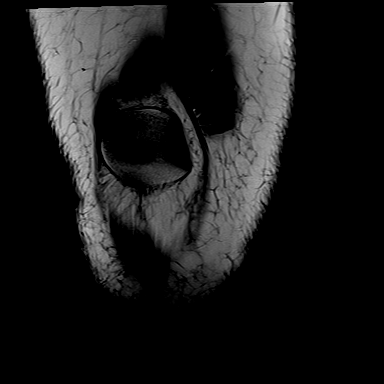
[im 11/28]
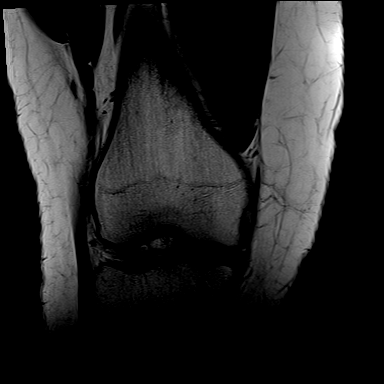
[im 17/28]
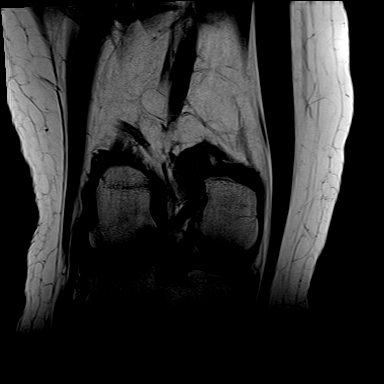
[im 22/28]
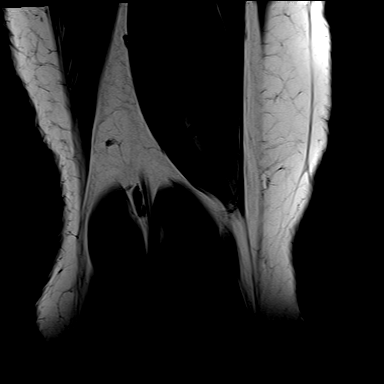
[im 28/28]
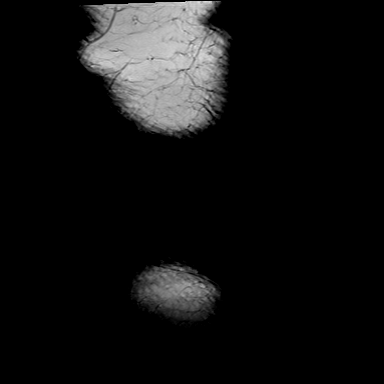

[Series 22: PD fat-sat · sagittal · right · 3.0mm · 0.59mm/px · 8 of 34 slices shown (1 of 2)]
[im 1/34]
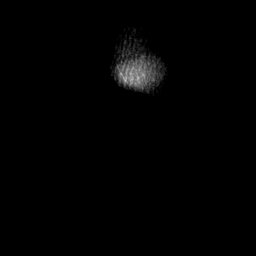
[im 5/34]
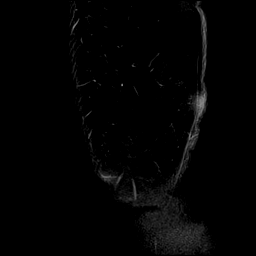
[im 10/34]
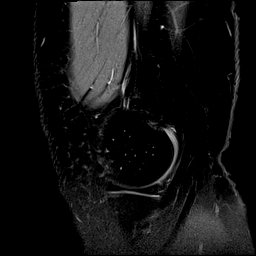
[im 15/34]
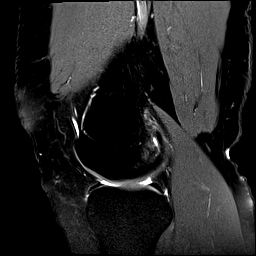
[im 19/34]
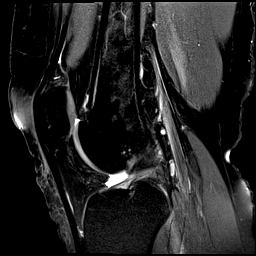
[im 24/34]
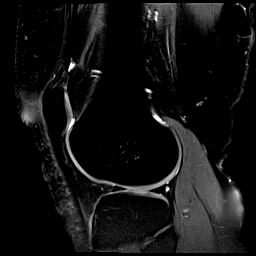
[im 29/34]
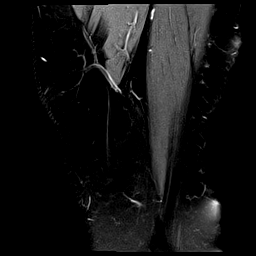
[im 34/34]
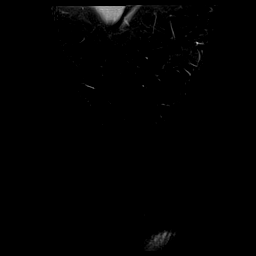

[Series 23: T2 fat-sat · coronal · right · 4.0mm · 0.59mm/px · 6 of 27 slices shown (2 of 3)]
[im 1/27]
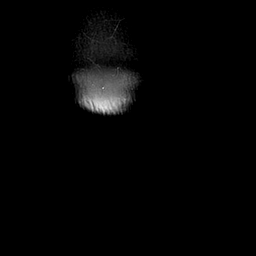
[im 6/27]
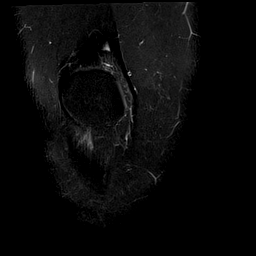
[im 11/27]
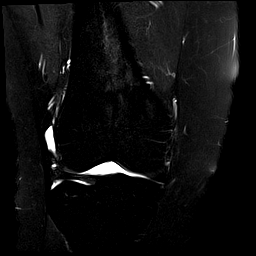
[im 16/27]
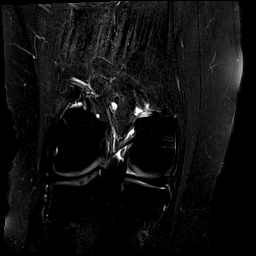
[im 21/27]
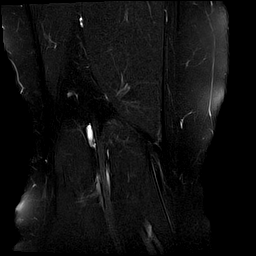
[im 27/27]
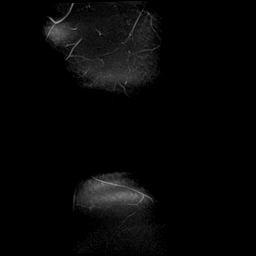

[Series 24: PD fat-sat · coronal · right · 4.0mm · 0.59mm/px · 6 of 28 slices shown (2 of 2)]
[im 1/28]
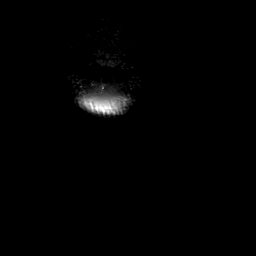
[im 6/28]
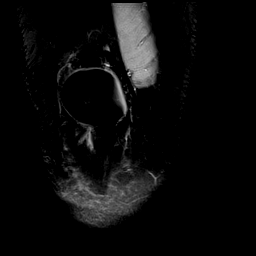
[im 11/28]
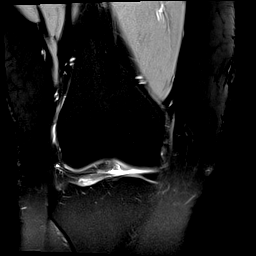
[im 17/28]
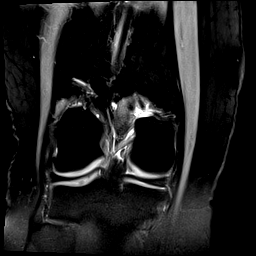
[im 22/28]
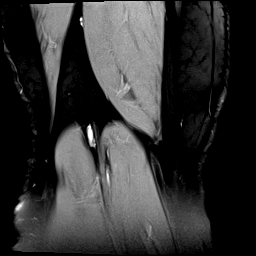
[im 28/28]
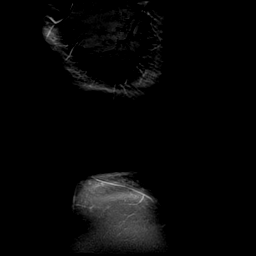

[Series 25: T2 fat-sat · sagittal · right · 3.0mm · 0.59mm/px · 8 of 36 slices shown (3 of 3)]
[im 1/36]
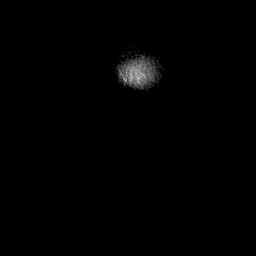
[im 6/36]
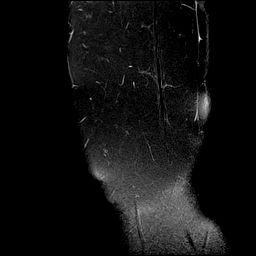
[im 11/36]
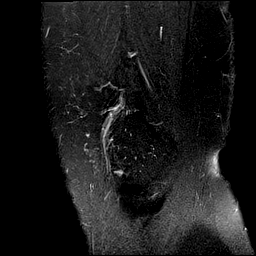
[im 16/36]
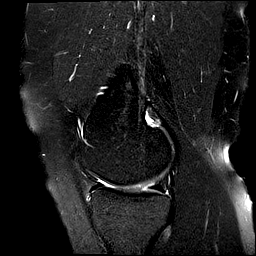
[im 21/36]
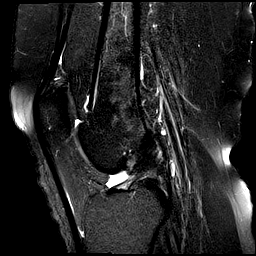
[im 26/36]
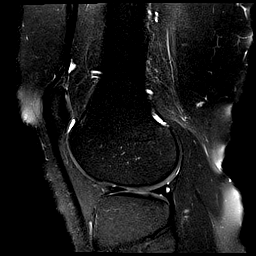
[im 31/36]
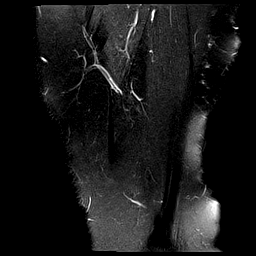
[im 36/36]
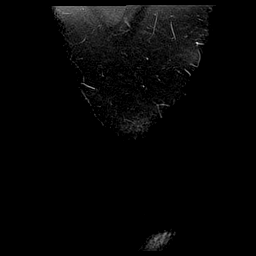

[40 of 40 positions shown; findings below may reference images not displayed]

FINDINGS: MENISCI

Medial meniscus:  Intact.

Lateral meniscus:  Intact.

LIGAMENTS

Cruciates:  Intact ACL and PCL.

Collaterals: Medial collateral ligament is intact. Lateral
collateral ligament complex is intact.

CARTILAGE

Patellofemoral:  Normal.

Medial:  Normal.

Lateral:  Normal.

Joint: No joint effusion. Unchanged mild edema in the superolateral
aspect of Hoffa's fat. No plical thickening.

Popliteal Fossa:  No Baker cyst. Intact popliteus tendon.

Extensor Mechanism: Intact quadriceps tendon and patellar tendon.
Intact medial and lateral patellar retinaculum. Intact MPFL.

Bones: No focal marrow signal abnormality. No fracture or
dislocation. Unchanged small intraosseous ganglion cyst in the
posterior lateral metaphysis of the distal femur.

Other: None.
IMPRESSION: 1. No evidence of internal derangement.
2. Unchanged mild edema in the superolateral aspect of Hoffa's fat
pad, which can be seen in the setting of patellar maltracking.
